# Patient Record
Sex: Female | Born: 1990 | Race: Black or African American | Hispanic: No | Marital: Single | Smoking: Current every day smoker
Health system: Southern US, Community
[De-identification: ages and names within clinical notes are randomized; demographics above are authoritative.]

## PROBLEM LIST (undated history)

## (undated) DIAGNOSIS — R569 Unspecified convulsions: Secondary | ICD-10-CM

---
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
## 2014-07-03 ENCOUNTER — Emergency Department (HOSPITAL_BASED_OUTPATIENT_CLINIC_OR_DEPARTMENT_OTHER)
Admission: EM | Admit: 2014-07-03 | Discharge: 2014-07-03 | Disposition: A | Discharge status: Home / Self Care | Payer: Self-pay | Attending: Emergency Medicine | Admitting: Emergency Medicine

## 2014-07-03 ENCOUNTER — Encounter (HOSPITAL_BASED_OUTPATIENT_CLINIC_OR_DEPARTMENT_OTHER): Payer: Self-pay | Admitting: Emergency Medicine

## 2014-07-03 DIAGNOSIS — Z3202 Encounter for pregnancy test, result negative: Secondary | ICD-10-CM | POA: Insufficient documentation

## 2014-07-03 DIAGNOSIS — Z720 Tobacco use: Secondary | ICD-10-CM | POA: Insufficient documentation

## 2014-07-03 DIAGNOSIS — N390 Urinary tract infection, site not specified: Secondary | ICD-10-CM | POA: Insufficient documentation

## 2014-07-03 DIAGNOSIS — G40909 Epilepsy, unspecified, not intractable, without status epilepticus: Secondary | ICD-10-CM | POA: Insufficient documentation

## 2014-07-03 DIAGNOSIS — Z79899 Other long term (current) drug therapy: Secondary | ICD-10-CM | POA: Insufficient documentation

## 2014-07-03 HISTORY — DX: Unspecified convulsions: R56.9

## 2014-07-03 LAB — URINALYSIS, ROUTINE W REFLEX MICROSCOPIC
Bilirubin Urine: NEGATIVE
Glucose, UA: NEGATIVE mg/dL
Hgb urine dipstick: NEGATIVE
KETONES UR: NEGATIVE mg/dL
NITRITE: NEGATIVE
PH: 6.5 (ref 5.0–8.0)
PROTEIN: NEGATIVE mg/dL
Specific Gravity, Urine: 1.028 (ref 1.005–1.030)
Urobilinogen, UA: 1 mg/dL (ref 0.0–1.0)

## 2014-07-03 LAB — URINE MICROSCOPIC-ADD ON

## 2014-07-03 LAB — PREGNANCY, URINE: Preg Test, Ur: NEGATIVE

## 2014-07-03 MED ORDER — SULFAMETHOXAZOLE-TRIMETHOPRIM 800-160 MG PO TABS
2.0000 | ORAL_TABLET | Freq: Once | ORAL | Status: AC
  Administered 2014-07-03: 2 via ORAL
  Filled 2014-07-03: qty 2

## 2014-07-03 MED ORDER — PHENAZOPYRIDINE HCL 200 MG PO TABS: 200.0000 mg | ORAL_TABLET | Freq: Three times a day (TID) | ORAL | Status: AC

## 2014-07-03 MED ORDER — SULFAMETHOXAZOLE-TRIMETHOPRIM 800-160 MG PO TABS: 1.0000 | ORAL_TABLET | Freq: Two times a day (BID) | ORAL | Status: AC

## 2014-07-03 MED ORDER — PHENAZOPYRIDINE HCL 100 MG PO TABS
200.0000 mg | ORAL_TABLET | Freq: Once | ORAL | Status: AC
  Administered 2014-07-03: 200 mg via ORAL
  Filled 2014-07-03: qty 2

## 2014-07-03 NOTE — ED Notes (Signed)
Pt states that she has had urinary problems, frequency and back pain on the right side.

## 2014-07-03 NOTE — ED Provider Notes (Signed)
CSN: [id number]     Arrival date & time 07/03/14  0056 History   First MD Initiated Contact with Patient 07/03/14 0102     Chief Complaint  Patient presents with  . Urinary Problems      (Consider location/radiation/quality/duration/timing/severity/associated sxs/prior Treatment) HPI 24 year old female presents to emergency department from home with complaint of bilateral flank pain, urinary frequency and burning.  Symptoms have been ongoing for 2 days.  No fever, chills, nausea or vomiting.  She denies any vaginal itching, discharge.  No new sexual partners.  Last menstrual period 2 weeks ago Past Medical History  Diagnosis Date  . Seizures    History reviewed. No pertinent past surgical history. History reviewed. No pertinent family history. History  Substance Use Topics  . Smoking status: Current Every Day Smoker  . Smokeless tobacco: Not on file  . Alcohol Use: Yes   OB History    No data available     Review of Systems  See History of Present Illness; otherwise all other systems are reviewed and negative   Allergies  Review of patient's allergies indicates no known allergies.  Home Medications   Prior to Admission medications   Medication Sig Start Date End Date Taking? Authorizing Provider  levETIRAcetam (KEPPRA) 1000 MG tablet Take 1,000 mg by mouth 2 (two) times daily.   Yes Historical Provider, MD  phenazopyridine (PYRIDIUM) 200 MG tablet Take 1 tablet (200 mg total) by mouth 3 (three) times daily. 07/03/14   Olivia Mackielga M Maddalynn Barnard, MD  sulfamethoxazole-trimethoprim (SEPTRA DS) 800-160 MG per tablet Take 1 tablet by mouth every 12 (twelve) hours. 07/03/14   Olivia Mackielga M Guilherme Schwenke, MD   BP 124/75 mmHg  Pulse 71  Temp(Src) 98.6 F (37 C) (Oral)  Resp 16  Wt 139 lb (63.05 kg)  SpO2 100%  LMP 06/19/2014 Physical Exam  Constitutional: She is oriented to person, place, and time. She appears well-developed and well-nourished.  HENT:  Head: Normocephalic and atraumatic.  Nose: Nose  normal.  Mouth/Throat: Oropharynx is clear and moist.  Eyes: Conjunctivae and EOM are normal. Pupils are equal, round, and reactive to light.  Neck: Normal range of motion. Neck supple. No JVD present. No tracheal deviation present. No thyromegaly present.  Cardiovascular: Normal rate, regular rhythm, normal heart sounds and intact distal pulses.  Exam reveals no gallop and no friction rub.   No murmur heard. Pulmonary/Chest: Effort normal and breath sounds normal. No stridor. No respiratory distress. She has no wheezes. She has no rales. She exhibits no tenderness.  Abdominal: Soft. Bowel sounds are normal. She exhibits no distension and no mass. There is no tenderness. There is no rebound and no guarding.  Musculoskeletal: Normal range of motion. She exhibits no edema or tenderness.  Lymphadenopathy:    She has no cervical adenopathy.  Neurological: She is alert and oriented to person, place, and time. She displays normal reflexes. She exhibits normal muscle tone. Coordination normal.  Skin: Skin is warm and dry. No rash noted. No erythema. No pallor.  Psychiatric: She has a normal mood and affect. Her behavior is normal. Judgment and thought content normal.  Nursing note and vitals reviewed.   ED Course  Procedures (including critical care time) Labs Review Labs Reviewed  URINALYSIS, ROUTINE W REFLEX MICROSCOPIC - Abnormal; Notable for the following:    APPearance CLOUDY (*)    Leukocytes, UA MODERATE (*)    All other components within normal limits  URINE MICROSCOPIC-ADD ON - Abnormal; Notable for the following:  Squamous Epithelial / LPF FEW (*)    Bacteria, UA FEW (*)    All other components within normal limits  PREGNANCY, URINE    Imaging Review No results found.   EKG Interpretation None      MDM   Final diagnoses:  UTI (lower urinary tract infection)    24 year old female with symptoms of UTI, urinalysis consistent with same.  She is not ill-appearing.  Plan  for treatment with Pyridium and Septra.    Olivia Mackie, MD 07/03/14 [id number]

## 2014-07-03 NOTE — Discharge Instructions (Signed)
Take antibiotics as prescribed for the next 5 days.  Plenty of fluids.  Return to emergency department for worsening pain, fevers, or other concerning symptoms.   Urinary Tract Infection Urinary tract infections (UTIs) can develop anywhere along your urinary tract. Your urinary tract is your body's drainage system for removing wastes and extra water. Your urinary tract includes two kidneys, two ureters, a bladder, and a urethra. Your kidneys are a pair of bean-shaped organs. Each kidney is about the size of your fist. They are located below your ribs, one on each side of your spine. CAUSES Infections are caused by microbes, which are microscopic organisms, including fungi, viruses, and bacteria. These organisms are so small that they can only be seen through a microscope. Bacteria are the microbes that most commonly cause UTIs. SYMPTOMS  Symptoms of UTIs may vary by age and gender of the patient and by the location of the infection. Symptoms in young women typically include a frequent and intense urge to urinate and a painful, burning feeling in the bladder or urethra during urination. Older women and men are more likely to be tired, shaky, and weak and have muscle aches and abdominal pain. A fever may mean the infection is in your kidneys. Other symptoms of a kidney infection include pain in your back or sides below the ribs, nausea, and vomiting. DIAGNOSIS To diagnose a UTI, your caregiver will ask you about your symptoms. Your caregiver also will ask to provide a urine sample. The urine sample will be tested for bacteria and white blood cells. White blood cells are made by your body to help fight infection. TREATMENT  Typically, UTIs can be treated with medication. Because most UTIs are caused by a bacterial infection, they usually can be treated with the use of antibiotics. The choice of antibiotic and length of treatment depend on your symptoms and the type of bacteria causing your infection. HOME  CARE INSTRUCTIONS  If you were prescribed antibiotics, take them exactly as your caregiver instructs you. Finish the medication even if you feel better after you have only taken some of the medication.  Drink enough water and fluids to keep your urine clear or pale yellow.  Avoid caffeine, tea, and carbonated beverages. They tend to irritate your bladder.  Empty your bladder often. Avoid holding urine for long periods of time.  Empty your bladder before and after sexual intercourse.  After a bowel movement, women should cleanse from front to back. Use each tissue only once. SEEK MEDICAL CARE IF:   You have back pain.  You develop a fever.  Your symptoms do not begin to resolve within 3 days. SEEK IMMEDIATE MEDICAL CARE IF:   You have severe back pain or lower abdominal pain.  You develop chills.  You have nausea or vomiting.  You have continued burning or discomfort with urination. MAKE SURE YOU:   Understand these instructions.  Will watch your condition.  Will get help right away if you are not doing well or get worse. Document Released: 03/24/2005 Document Revised: 12/14/2011 Document Reviewed: 07/23/2011 [facility] Patient Information 2015 [place], [place]. This information is not intended to replace advice given to you by your health care provider. Make sure you discuss any questions you have with your health care provider.

## 2015-09-20 ENCOUNTER — Encounter (HOSPITAL_BASED_OUTPATIENT_CLINIC_OR_DEPARTMENT_OTHER): Payer: Self-pay | Admitting: Emergency Medicine

## 2015-09-20 ENCOUNTER — Emergency Department (HOSPITAL_BASED_OUTPATIENT_CLINIC_OR_DEPARTMENT_OTHER)
Admission: EM | Admit: 2015-09-20 | Discharge: 2015-09-20 | Disposition: A | Discharge status: Home / Self Care | Payer: Self-pay | Attending: Emergency Medicine | Admitting: Emergency Medicine

## 2015-09-20 ENCOUNTER — Emergency Department (HOSPITAL_BASED_OUTPATIENT_CLINIC_OR_DEPARTMENT_OTHER): Payer: Self-pay

## 2015-09-20 DIAGNOSIS — O219 Vomiting of pregnancy, unspecified: Secondary | ICD-10-CM | POA: Insufficient documentation

## 2015-09-20 DIAGNOSIS — O99331 Smoking (tobacco) complicating pregnancy, first trimester: Secondary | ICD-10-CM | POA: Insufficient documentation

## 2015-09-20 DIAGNOSIS — O99281 Endocrine, nutritional and metabolic diseases complicating pregnancy, first trimester: Secondary | ICD-10-CM | POA: Insufficient documentation

## 2015-09-20 DIAGNOSIS — F17200 Nicotine dependence, unspecified, uncomplicated: Secondary | ICD-10-CM | POA: Insufficient documentation

## 2015-09-20 DIAGNOSIS — Z79899 Other long term (current) drug therapy: Secondary | ICD-10-CM | POA: Insufficient documentation

## 2015-09-20 DIAGNOSIS — Z3A01 Less than 8 weeks gestation of pregnancy: Secondary | ICD-10-CM | POA: Insufficient documentation

## 2015-09-20 DIAGNOSIS — E876 Hypokalemia: Secondary | ICD-10-CM | POA: Insufficient documentation

## 2015-09-20 DIAGNOSIS — O210 Mild hyperemesis gravidarum: Secondary | ICD-10-CM

## 2015-09-20 LAB — CBC WITH DIFFERENTIAL/PLATELET
BASOS ABS: 0 10*3/uL (ref 0.0–0.1)
Basophils Relative: 0 %
EOS PCT: 0 %
Eosinophils Absolute: 0 10*3/uL (ref 0.0–0.7)
HCT: 36.1 % (ref 36.0–46.0)
Hemoglobin: 13.8 g/dL (ref 12.0–15.0)
LYMPHS ABS: 1 10*3/uL (ref 0.7–4.0)
LYMPHS PCT: 8 %
MCH: 29.3 pg (ref 26.0–34.0)
MCHC: 38.2 g/dL — ABNORMAL HIGH (ref 30.0–36.0)
MCV: 76.6 fL — AB (ref 78.0–100.0)
MONO ABS: 1 10*3/uL (ref 0.1–1.0)
Monocytes Relative: 8 %
Neutro Abs: 10.8 10*3/uL — ABNORMAL HIGH (ref 1.7–7.7)
Neutrophils Relative %: 85 %
PLATELETS: 336 10*3/uL (ref 150–400)
RBC: 4.71 MIL/uL (ref 3.87–5.11)
RDW: 13.1 % (ref 11.5–15.5)
WBC: 12.7 10*3/uL — AB (ref 4.0–10.5)

## 2015-09-20 LAB — BASIC METABOLIC PANEL
ANION GAP: 13 (ref 5–15)
BUN: 9 mg/dL (ref 6–20)
CO2: 22 mmol/L (ref 22–32)
Calcium: 9.5 mg/dL (ref 8.9–10.3)
Chloride: 96 mmol/L — ABNORMAL LOW (ref 101–111)
Creatinine, Ser: 0.68 mg/dL (ref 0.44–1.00)
GFR calc Af Amer: >60 mL/min (ref >60)
GFR calc non Af Amer: >60 mL/min (ref >60)
GLUCOSE: 93 mg/dL (ref 65–99)
POTASSIUM: 2.7 mmol/L — AB (ref 3.5–5.1)
Sodium: 131 mmol/L — ABNORMAL LOW (ref 135–145)

## 2015-09-20 LAB — HCG, QUANTITATIVE, PREGNANCY: hCG, Beta Chain, Quant, S: 127047 m[IU]/mL — ABNORMAL HIGH (ref <5)

## 2015-09-20 MED ORDER — POTASSIUM CHLORIDE ER 20 MEQ PO TBCR: 10.0000 meq | EXTENDED_RELEASE_TABLET | Freq: Every day | ORAL | Status: AC

## 2015-09-20 MED ORDER — SODIUM CHLORIDE 0.9 % IV BOLUS (SEPSIS)
1000.0000 mL | Freq: Once | INTRAVENOUS | Status: AC
  Administered 2015-09-20: 1000 mL via INTRAVENOUS

## 2015-09-20 MED ORDER — POTASSIUM CHLORIDE 10 MEQ/100ML IV SOLN
10.0000 meq | Freq: Once | INTRAVENOUS | Status: AC
  Administered 2015-09-20: 10 meq via INTRAVENOUS
  Filled 2015-09-20: qty 100

## 2015-09-20 MED ORDER — POTASSIUM CHLORIDE CRYS ER 20 MEQ PO TBCR
20.0000 meq | EXTENDED_RELEASE_TABLET | Freq: Once | ORAL | Status: AC
  Administered 2015-09-20: 20 meq via ORAL
  Filled 2015-09-20: qty 1

## 2015-09-20 MED ORDER — ONDANSETRON HCL 4 MG PO TABS: 4.0000 mg | ORAL_TABLET | Freq: Three times a day (TID) | ORAL | Status: AC | PRN

## 2015-09-20 MED ORDER — ONDANSETRON HCL 4 MG/2ML IJ SOLN
4.0000 mg | Freq: Once | INTRAMUSCULAR | Status: AC
  Administered 2015-09-20: 4 mg via INTRAVENOUS
  Filled 2015-09-20: qty 2

## 2015-09-20 NOTE — ED Provider Notes (Signed)
CSN: [id number]     Arrival date & time 09/20/15  1432 History  By signing my name below, I, Candace Michael, attest that this documentation has been prepared under the direction and in the presence of Nelva Nayobert Tell Rozelle, MD. Electronically Signed: Bethel BornBritney Michael, ED Scribe. 09/20/2015. 4:37 PM   Chief Complaint  Patient presents with  . Vomiting    The history is provided by the patient. No language interpreter was used.   Candace Michael is a 25 y.o. female who presents to the Emergency Department complaining of nausea and vomiting with onset 15 days ago. Pt states that last night after vomiting she had a near syncopal episode. After the episode her lips started to tingle and her hands "locked up". She last had an episode of emesis just PTA.  She is pregnant, for the first time, and unsure how far along she is. Her menstrual periods are irregular but the last one was 4-5 months ago. Pt states that she has been unable to be seen for her pregnancy due to an insurance issue.   Past Medical History  Diagnosis Date  . Seizures (HCC)    History reviewed. No pertinent past surgical history. History reviewed. No pertinent family history. Social History  Substance Use Topics  . Smoking status: Current Every Day Smoker  . Smokeless tobacco: None  . Alcohol Use: Yes   OB History    Gravida Para Term Preterm AB TAB SAB Ectopic Multiple Living   1              Review of Systems  10 Systems reviewed and all are negative for acute change except as noted in the HPI.  Allergies  Review of patient's allergies indicates no known allergies.  Home Medications   Prior to Admission medications   Medication Sig Start Date End Date Taking? Authorizing Provider  levETIRAcetam (KEPPRA) 1000 MG tablet Take 1,000 mg by mouth 2 (two) times daily.    Historical Provider, MD  ondansetron (ZOFRAN) 4 MG tablet Take 1 tablet (4 mg total) by mouth every 8 (eight) hours as needed for nausea or vomiting.  09/20/15   Nelva Nayobert Najia Hurlbutt, MD  phenazopyridine (PYRIDIUM) 200 MG tablet Take 1 tablet (200 mg total) by mouth 3 (three) times daily. 07/03/14   Marisa Severinlga Otter, MD  potassium chloride 20 MEQ TBCR Take 10 mEq by mouth daily. 09/20/15   Nelva Nayobert Jericka Kadar, MD  sulfamethoxazole-trimethoprim (SEPTRA DS) 800-160 MG per tablet Take 1 tablet by mouth every 12 (twelve) hours. 07/03/14   Marisa Severinlga Otter, MD   BP 129/99 mmHg  Pulse 114  Temp(Src) 97.6 F (36.4 C) (Oral)  Resp 26  Ht 5' (1.524 m)  Wt 139 lb (63.05 kg)  BMI 27.15 kg/m2  SpO2 100%  LMP  (LMP Unknown) Physical Exam Physical Exam  Nursing note and vitals reviewed. Constitutional: She is oriented to person, place, and time. She appears well-developed and well-nourished. No distress.  HENT:  Head: Normocephalic and atraumatic.  Eyes: Pupils are equal, round, and reactive to light.  Neck: Normal range of motion.  Cardiovascular: Normal rate and intact distal pulses.   Pulmonary/Chest: No respiratory distress.  Abdominal: Normal appearance. She exhibits no distension.  No tenderness to palpation.  Active bowel sounds.  No rebound or guarding tenderness.  No mass.   Musculoskeletal: Normal range of motion.  Neurological: She is alert and oriented to person, place, and time. No cranial nerve deficit.  Skin: Skin is warm and dry. No rash noted.  ED Course  Procedures (including critical care time) Medications  sodium chloride 0.9 % bolus 1,000 mL (0 mLs Intravenous Stopped 09/20/15 1636)  ondansetron (ZOFRAN) injection 4 mg (4 mg Intravenous Given 09/20/15 1528)  potassium chloride 10 mEq in 100 mL IVPB (0 mEq Intravenous Stopped 09/20/15 1818)  potassium chloride SA (K-DUR,KLOR-CON) CR tablet 20 mEq (20 mEq Oral Given 09/20/15 1722)    DIAGNOSTIC STUDIES: Oxygen Saturation is 100% on RA,  normal by my interpretation.    COORDINATION OF CARE: 3:09 PM Discussed treatment plan which includes lab work, Zofran, and IVF with pt at bedside and pt agreed to  plan.  Labs Review Labs Reviewed  CBC WITH DIFFERENTIAL/PLATELET - Abnormal; Notable for the following:    WBC 12.7 (*)    MCV 76.6 (*)    MCHC 38.2 (*)    Neutro Abs 10.8 (*)    All other components within normal limits  BASIC METABOLIC PANEL - Abnormal; Notable for the following:    Sodium 131 (*)    Potassium 2.7 (*)    Chloride 96 (*)    All other components within normal limits  HCG, QUANTITATIVE, PREGNANCY - Abnormal; Notable for the following:    hCG, Beta Chain, [place] Longest [id number] (*)    All other components within normal limits    Imaging Review US Ob Comp Less 14 Wks  09/20/2015  CLINICAL DATA:  Severe emesis x 15 days, unable to keep anything down. Patient has no prenatal care and is unsure of last period, thinks it was 6 months ago. Patient had positive pregnancy test in [facility] on 09/05/15. Patient denies any pelvic pain or vag bleeding. States she just feels "bad". EXAM: OBSTETRIC <14 WK [place] AND TRANSVAGINAL OB US TECHNIQUE: Both transabdominal and transvaginal ultrasound examinations were performed for complete evaluation of the gestation as well as the maternal uterus, adnexal regions, and pelvic cul-de-sac. Transvaginal technique was performed to assess early pregnancy. COMPARISON:  None. FINDINGS: Intrauterine gestational sac: Visualized/normal in shape. Yolk sac:  Yes Embryo:  Yes Cardiac Activity: Yes Heart Rate: 144  bpm CRL:  12.7  mm   7 w   3 d                  [place] EDC: 05/05/2016 Subchorionic hemorrhage: Small subchronic hemorrhage below the gestational sac. Maternal uterus/adnexae: No uterine masses. Cervix unremarkable. Large right ovarian simple appearing cyst measuring 6.2 x 5.0 x 5.6 cm. Color Doppler blood flow was seen within the ovarian tissue along the margins of this cyst. Normal appearance of the left ovary. No adnexal masses. No free fluid. IMPRESSION: 1. Single live intrauterine pregnancy with a measured gestational age is 7 weeks and 3 days. 2. Small subchronic  hemorrhage. 3. 6.2 cm right ovarian simple appearing cyst. 4. No other abnormalities. Electronically Signed   By: Amie Portland M.D.   On: 09/20/2015 17:53   US Ob Transvaginal  09/20/2015  CLINICAL DATA:  Severe emesis x 15 days, unable to keep anything down. Patient has no prenatal care and is unsure of last period, thinks it was 6 months ago. Patient had positive pregnancy test in [facility] on 09/05/15. Patient denies any pelvic pain or vag bleeding. States she just feels "bad". EXAM: OBSTETRIC <14 WK [place] AND TRANSVAGINAL OB US TECHNIQUE: Both transabdominal and transvaginal ultrasound examinations were performed for complete evaluation of the gestation as well as the maternal uterus, adnexal regions, and pelvic cul-de-sac. Transvaginal technique was performed to assess early pregnancy. COMPARISON:  None. FINDINGS: Intrauterine gestational sac: Visualized/normal in shape. Yolk sac:  Yes Embryo:  Yes Cardiac Activity: Yes Heart Rate: 144  bpm CRL:  12.7  mm   7 w   3 d                  [place] EDC: 05/05/2016 Subchorionic hemorrhage: Small subchronic hemorrhage below the gestational sac. Maternal uterus/adnexae: No uterine masses. Cervix unremarkable. Large right ovarian simple appearing cyst measuring 6.2 x 5.0 x 5.6 cm. Color Doppler blood flow was seen within the ovarian tissue along the margins of this cyst. Normal appearance of the left ovary. No adnexal masses. No free fluid. IMPRESSION: 1. Single live intrauterine pregnancy with a measured gestational age is 7 weeks and 3 days. 2. Small subchronic hemorrhage. 3. 6.2 cm right ovarian simple appearing cyst. 4. No other abnormalities. Electronically Signed   By: Amie Portland M.D.   On: 09/20/2015 17:53   I personally reviewed and evaluated these images and lab results as a part of my medical decision-making.  After treatment in the ED the patient feels back to baseline and wants to go home.  MDM   Final diagnoses:  Hyperemesis arising during pregnancy   Hypokalemia    I personally performed the services described in this documentation, which was scribed in my presence. The recorded information has been reviewed and considered.   Nelva Nay, MD 09/20/15 [phone]

## 2015-09-20 NOTE — Discharge Instructions (Signed)
Eating Plan for Hyperemesis Gravidarum Severe cases of hyperemesis gravidarum can lead to dehydration and malnutrition. The hyperemesis eating plan is one way to lessen the symptoms of nausea and vomiting. It is often used with prescribed medicines to control your symptoms.  WHAT CAN I DO TO RELIEVE MY SYMPTOMS? Listen to your body. Everyone is different and has different preferences. Find what works best for you. Some of the following things may help:  Eat and drink slowly.  Eat 5-6 small meals daily instead of 3 large meals.   Eat crackers before you get out of bed in the morning.   Starchy foods are usually well tolerated (such as cereal, toast, bread, potatoes, pasta, rice, and pretzels).   Ginger may help with nausea. Add  tsp ground ginger to hot tea or choose ginger tea.   Try drinking 100% fruit juice or an electrolyte drink.  Continue to take your prenatal vitamins as directed by your health care provider. If you are having trouble taking your prenatal vitamins, talk with your health care provider about different options.  Include at least 1 serving of protein with your meals and snacks (such as meats or poultry, beans, nuts, eggs, or yogurt). Try eating a protein-rich snack before bed (such as cheese and crackers or a half [place] or peanut butter sandwich). WHAT THINGS SHOULD I AVOID TO REDUCE MY SYMPTOMS? The following things may help reduce your symptoms:  Avoid foods with strong smells. Try eating meals in well-ventilated areas that are free of odors.  Avoid drinking water or other beverages with meals. Try not to drink anything less than 30 minutes before and after meals.  Avoid drinking more than 1 cup of fluid at a time.  Avoid fried or high-fat foods, such as butter and cream sauces.  Avoid spicy foods.  Avoid skipping meals the best you can. Nausea can be more intense on an empty stomach. If you cannot tolerate food at that time, do not force it. Try sucking on  ice chips or other frozen items and make up the calories later.  Avoid lying down within 2 hours after eating.   This information is not intended to replace advice given to you by your health care provider. Make sure you discuss any questions you have with your health care provider.   Document Released: 04/11/2007 Document Revised: 06/19/2013 Document Reviewed: 04/18/2013 Elsevier Interactive Patient Education 2016 [employer]  Hypokalemia Hypokalemia means that the amount of potassium in the blood is lower than normal.Potassium is a chemical, called an electrolyte, that helps regulate the amount of fluid in the body. It also stimulates muscle contraction and helps nerves function properly.Most of the body's potassium is inside of cells, and only a very small amount is in the blood. Because the amount in the blood is so small, minor changes can be life-threatening. CAUSES  Antibiotics.  Diarrhea or vomiting.  Using laxatives too much, which can cause diarrhea.  Chronic kidney disease.  Water pills (diuretics).  Eating disorders (bulimia).  Low magnesium level.  Sweating a lot. SIGNS AND SYMPTOMS  Weakness.  Constipation.  Fatigue.  Muscle cramps.  Mental confusion.  Skipped heartbeats or irregular heartbeat (palpitations).  Tingling or numbness. DIAGNOSIS  Your health care provider can diagnose hypokalemia with blood tests. In addition to checking your potassium level, your health care provider may also check other lab tests. TREATMENT Hypokalemia can be treated with potassium supplements taken by mouth or adjustments in your current medicines. If your potassium level  is very low, you may need to get potassium through a vein (IV) and be monitored in the hospital. A diet high in potassium is also helpful. Foods high in potassium are:  Nuts, such as peanuts and pistachios.  Seeds, such as sunflower seeds and pumpkin seeds.  Peas, lentils, and lima  beans.  Whole grain and bran cereals and breads.  Fresh fruit and vegetables, such as apricots, avocado, bananas, cantaloupe, kiwi, oranges, tomatoes, asparagus, and potatoes.  Orange and tomato juices.  Red meats.  Fruit yogurt. HOME CARE INSTRUCTIONS  Take all medicines as prescribed by your health care provider.  Maintain a healthy diet by including nutritious food, such as fruits, vegetables, nuts, whole grains, and lean meats.  If you are taking a laxative, be sure to follow the directions on the label. SEEK MEDICAL CARE IF:  Your weakness gets worse.  You feel your heart pounding or racing.  You are vomiting or having diarrhea.  You are diabetic and having trouble keeping your blood glucose in the normal range. SEEK IMMEDIATE MEDICAL CARE IF:  You have chest pain, shortness of breath, or dizziness.  You are vomiting or having diarrhea for more than 2 days.  You faint. MAKE SURE YOU:   Understand these instructions.  Will watch your condition.  Will get help right away if you are not doing well or get worse.   This information is not intended to replace advice given to you by your health care provider. Make sure you discuss any questions you have with your health care provider.   Document Released: 06/14/2005 Document Revised: 07/05/2014 Document Reviewed: 12/15/2012 Elsevier Interactive Patient Education 2016 Elsevier Inc.  Potassium Content of Foods Potassium is a mineral found in many foods and drinks. It helps keep fluids and minerals balanced in your body and affects how steadily your heart beats. Potassium also helps control your blood pressure and keep your muscles and nervous system healthy. Certain health conditions and medicines may change the balance of potassium in your body. When this happens, you can help balance your level of potassium through the foods that you do or do not eat. Your health care provider or dietitian may recommend an amount of  potassium that you should have each day. The following lists of foods provide the amount of potassium (in parentheses) per serving in each item. HIGH IN POTASSIUM  The following foods and beverages have 200 mg or more of potassium per serving:  Apricots, 2 raw or 5 dry (200 mg).  Artichoke, 1 medium (345 mg).  Avocado, raw,  each (245 mg).  Banana, 1 medium (425 mg).  Beans, lima, or baked beans, canned,  cup (280 mg).  Beans, white, canned,  cup (595 mg).  Beef roast, 3 oz (320 mg).  Beef, ground, 3 oz (270 mg).  Beets, raw or cooked,  cup (260 mg).  Bran muffin, 2 oz (300 mg).  Broccoli,  cup (230 mg).  Brussels sprouts,  cup (250 mg).  Cantaloupe,  cup (215 mg).  Cereal, 100% bran,  cup (200-400 mg).  Cheeseburger, single, fast food, 1 each (225-400 mg).  Chicken, 3 oz (220 mg).  Clams, canned, 3 oz (535 mg).  Crab, 3 oz (225 mg).  Dates, 5 each (270 mg).  Dried beans and peas,  cup (300-475 mg).  Figs, dried, 2 each (260 mg).  Fish: halibut, tuna, cod, snapper, 3 oz (480 mg).  Fish: salmon, haddock, swordfish, perch, 3 oz (300 mg).  Fish, tuna, canned  3 oz (200 mg).  [place] fries, fast food, 3 oz (470 mg).  Granola with fruit and nuts,  cup (200 mg).  Grapefruit juice,  cup (200 mg).  Greens, beet,  cup (655 mg).  Honeydew melon,  cup (200 mg).  Kale, raw, 1 cup (300 mg).  Kiwi, 1 medium (240 mg).  Kohlrabi, rutabaga, parsnips,  cup (280 mg).  Lentils,  cup (365 mg).  Mango, 1 each (325 mg).  Milk, chocolate, 1 cup (420 mg).  Milk: nonfat, low-fat, whole, buttermilk, 1 cup (350-380 mg).  Molasses, 1 Tbsp (295 mg).  Mushrooms,  cup (280) mg.  Nectarine, 1 each (275 mg).  Nuts: almonds, peanuts, hazelnuts, [place], cashew, mixed, 1 oz (200 mg).  Nuts, pistachios, 1 oz (295 mg).  Orange, 1 each (240 mg).  Orange juice,  cup (235 mg).  Papaya, medium,  fruit (390 mg).  Peanut butter, chunky, 2 Tbsp (240  mg).  Peanut butter, smooth, 2 Tbsp (210 mg).  Pear, 1 medium (200 mg).  Pomegranate, 1 whole (400 mg).  Pomegranate juice,  cup (215 mg).  Pork, 3 oz (350 mg).  Potato chips, salted, 1 oz (465 mg).  Potato, baked with skin, 1 medium (925 mg).  Potatoes, boiled,  cup (255 mg).  Potatoes, mashed,  cup (330 mg).  Prune juice,  cup (370 mg).  Prunes, 5 each (305 mg).  Pudding, chocolate,  cup (230 mg).  Pumpkin, canned,  cup (250 mg).  Raisins, seedless,  cup (270 mg).  Seeds, sunflower or pumpkin, 1 oz (240 mg).  Soy milk, 1 cup (300 mg).  Spinach,  cup (420 mg).  Spinach, canned,  cup (370 mg).  Sweet potato, baked with skin, 1 medium (450 mg).  Swiss chard,  cup (480 mg).  Tomato or vegetable juice,  cup (275 mg).  Tomato sauce or puree,  cup (400-550 mg).  Tomato, raw, 1 medium (290 mg).  Tomatoes, canned,  cup (200-300 mg).  [place], 3 oz (250 mg).  Wheat germ, 1 oz (250 mg).  Winter squash,  cup (250 mg).  Yogurt, plain or fruited, 6 oz (260-435 mg).  Zucchini,  cup (220 mg). MODERATE IN POTASSIUM The following foods and beverages have 50-200 mg of potassium per serving:  Apple, 1 each (150 mg).  Apple juice,  cup (150 mg).  Applesauce,  cup (90 mg).  Apricot nectar,  cup (140 mg).  Asparagus, small spears,  cup or 6 spears (155 mg).  Bagel, cinnamon raisin, 1 each (130 mg).  Bagel, egg or plain, 4 in., 1 each (70 mg).  Beans, green,  cup (90 mg).  Beans, yellow,  cup (190 mg).  Beer, regular, 12 oz (100 mg).  Beets, canned,  cup (125 mg).  Blackberries,  cup (115 mg).  Blueberries,  cup (60 mg).  Bread, whole wheat, 1 slice (70 mg).  Broccoli, raw,  cup (145 mg).  Cabbage,  cup (150 mg).  Carrots, cooked or raw,  cup (180 mg).  Cauliflower, raw,  cup (150 mg).  Celery, raw,  cup (155 mg).  Cereal, bran flakes, cup (120-150 mg).  Cheese, cottage,  cup (110 mg).  Cherries, 10 each  (150 mg).  Chocolate, 1 oz bar (165 mg).  Coffee, brewed 6 oz (90 mg).  Corn,  cup or 1 ear (195 mg).  Cucumbers,  cup (80 mg).  Egg, large, 1 each (60 mg).  Eggplant,  cup (60 mg).  Endive, raw, cup (80 mg).  English muffin, 1 each (  65 mg).  Fish, orange roughy, 3 oz (150 mg).  Frankfurter, beef or pork, 1 each (75 mg).  Fruit cocktail,  cup (115 mg).  Grape juice,  cup (170 mg).  Grapefruit,  fruit (175 mg).  Grapes,  cup (155 mg).  Greens: kale, turnip, collard,  cup (110-150 mg).  Ice cream or frozen yogurt, chocolate,  cup (175 mg).  Ice cream or frozen yogurt, vanilla,  cup (120-150 mg).  Lemons, limes, 1 each (80 mg).  Lettuce, all types, 1 cup (100 mg).  Mixed vegetables,  cup (150 mg).  Mushrooms, raw,  cup (110 mg).  Nuts: walnuts, pecans, or macadamia, 1 oz (125 mg).  Oatmeal,  cup (80 mg).  Okra,  cup (110 mg).  Onions, raw,  cup (120 mg).  Peach, 1 each (185 mg).  Peaches, canned,  cup (120 mg).  Pears, canned,  cup (120 mg).  Peas, green, frozen,  cup (90 mg).  Peppers, green,  cup (130 mg).  Peppers, red,  cup (160 mg).  Pineapple juice,  cup (165 mg).  Pineapple, fresh or canned,  cup (100 mg).  Plums, 1 each (105 mg).  Pudding, vanilla,  cup (150 mg).  Raspberries,  cup (90 mg).  Rhubarb,  cup (115 mg).  Rice, wild,  cup (80 mg).  Shrimp, 3 oz (155 mg).  Spinach, raw, 1 cup (170 mg).  Strawberries,  cup (125 mg).  Summer squash  cup (175-200 mg).  Swiss chard, raw, 1 cup (135 mg).  Tangerines, 1 each (140 mg).  Tea, brewed, 6 oz (65 mg).  Turnips,  cup (140 mg).  Watermelon,  cup (85 mg).  Wine, red, table, 5 oz (180 mg).  Wine, white, table, 5 oz (100 mg). LOW IN POTASSIUM The following foods and beverages have less than 50 mg of potassium per serving.  Bread, white, 1 slice (30 mg).  Carbonated beverages, 12 oz (less than 5 mg).  Cheese, 1 oz (20-30  mg).  Cranberries,  cup (45 mg).  Cranberry juice cocktail,  cup (20 mg).  Fats and oils, 1 Tbsp (less than 5 mg).  Hummus, 1 Tbsp (32 mg).  Nectar: papaya, mango, or pear,  cup (35 mg).  Rice, white or brown,  cup (50 mg).  Spaghetti or macaroni,  cup cooked (30 mg).  Tortilla, flour or corn, 1 each (50 mg).  Waffle, 4 in., 1 each (50 mg).  Water chestnuts,  cup (40 mg).   This information is not intended to replace advice given to you by your health care provider. Make sure you discuss any questions you have with your health care provider.   Document Released: 01/26/2005 Document Revised: 06/19/2013 Document Reviewed: 05/11/2013 Elsevier Interactive Patient Education 2016 Elsevier Inc.  Morning Sickness Morning sickness is when you feel sick to your stomach (nauseous) during pregnancy. You may feel sick to your stomach and throw up (vomit). You may feel sick in the morning, but you can feel this way any time of day. Some women feel very sick to their stomach and cannot stop throwing up (hyperemesis gravidarum). HOME CARE  Only take medicines as told by your doctor.  Take multivitamins as told by your doctor. Taking multivitamins before getting pregnant can stop or lessen the harshness of morning sickness.  Eat dry toast or unsalted crackers before getting out of bed.  Eat 5 to 6 small meals a day.  Eat dry and bland foods like rice and baked potatoes.  Do not drink liquids with  meals. Drink between meals.  Do not eat greasy, fatty, or spicy foods.  Have someone cook for you if the smell of food causes you to feel sick or throw up.  If you feel sick to your stomach after taking prenatal vitamins, take them at night or with a snack.  Eat protein when you need a snack (nuts, yogurt, cheese).  Eat unsweetened gelatins for dessert.  Wear a bracelet used for sea sickness (acupressure wristband).  Go to a doctor that puts thin needles into certain body points  (acupuncture) to improve how you feel.  Do not smoke.  Use a humidifier to keep the air in your house free of odors.  Get lots of fresh air. GET HELP IF:  You need medicine to feel better.  You feel dizzy or lightheaded.  You are losing weight. GET HELP RIGHT AWAY IF:   You feel very sick to your stomach and cannot stop throwing up.  You pass out (faint). MAKE SURE YOU:  Understand these instructions.  Will watch your condition.  Will get help right away if you are not doing well or get worse.   This information is not intended to replace advice given to you by your health care provider. Make sure you discuss any questions you have with your health care provider.   Document Released: 07/22/2004 Document Revised: 07/05/2014 Document Reviewed: 11/29/2012 Elsevier Interactive Patient Education [employer]

## 2015-09-20 NOTE — ED Notes (Signed)
Patient pregnant was told this about 2 weeks ago, the patient reports that she is "worried" because "my hands keep locking up". The patient reports that she is unsure how far a long sh eis is, she is unable to get seen. She is unsure of when her last period was because she has irregular periods. Patient is crying and very anxious in triage. The patient reports that she keeps vomiting and she is "really upset because of her hand locking up and her lips numb and tingling"

## 2015-09-20 NOTE — ED Notes (Signed)
Dr. Radford PaxBeaton notified of potassium 2.7.

## 2017-06-21 ENCOUNTER — Emergency Department (HOSPITAL_BASED_OUTPATIENT_CLINIC_OR_DEPARTMENT_OTHER)
Admission: EM | Admit: 2017-06-21 | Discharge: 2017-06-21 | Disposition: A | Discharge status: Home / Self Care | Payer: Self-pay | Attending: Emergency Medicine | Admitting: Emergency Medicine

## 2017-06-21 ENCOUNTER — Encounter (HOSPITAL_BASED_OUTPATIENT_CLINIC_OR_DEPARTMENT_OTHER): Payer: Self-pay | Admitting: Emergency Medicine

## 2017-06-21 ENCOUNTER — Other Ambulatory Visit: Payer: Self-pay

## 2017-06-21 DIAGNOSIS — M545 Low back pain, unspecified: Secondary | ICD-10-CM

## 2017-06-21 DIAGNOSIS — Z79899 Other long term (current) drug therapy: Secondary | ICD-10-CM | POA: Insufficient documentation

## 2017-06-21 DIAGNOSIS — R350 Frequency of micturition: Secondary | ICD-10-CM | POA: Insufficient documentation

## 2017-06-21 DIAGNOSIS — R3915 Urgency of urination: Secondary | ICD-10-CM

## 2017-06-21 DIAGNOSIS — F17200 Nicotine dependence, unspecified, uncomplicated: Secondary | ICD-10-CM | POA: Insufficient documentation

## 2017-06-21 LAB — URINALYSIS, ROUTINE W REFLEX MICROSCOPIC
Bilirubin Urine: NEGATIVE
GLUCOSE, UA: NEGATIVE mg/dL
Hgb urine dipstick: NEGATIVE
Ketones, ur: 15 mg/dL — AB
LEUKOCYTES UA: NEGATIVE
Nitrite: NEGATIVE
Protein, ur: NEGATIVE mg/dL
Specific Gravity, Urine: >1.03 — ABNORMAL HIGH (ref 1.005–1.030)
pH: 6 (ref 5.0–8.0)

## 2017-06-21 LAB — PREGNANCY, URINE: Preg Test, Ur: NEGATIVE

## 2017-06-21 MED ORDER — NAPROXEN 500 MG PO TABS: 500.0000 mg | ORAL_TABLET | Freq: Two times a day (BID) | ORAL | 0 refills | Status: DC

## 2017-06-21 MED ORDER — NAPROXEN 250 MG PO TABS
500.0000 mg | ORAL_TABLET | Freq: Once | ORAL | Status: AC
  Administered 2017-06-21: 500 mg via ORAL
  Filled 2017-06-21: qty 2

## 2017-06-21 NOTE — Discharge Instructions (Signed)
You were seen today for urinary urgency and back pain.  Your urine is normal.  Urine culture is pending.  Take naproxen as needed for pain.  If you develop fevers or worsening symptoms you should be reevaluated.

## 2017-06-21 NOTE — ED Provider Notes (Signed)
[facility] EMERGENCY DEPARTMENT Provider Note   CSN: [id number] Arrival date & time: 06/21/17  [id number]     History   Chief Complaint Chief Complaint  Patient presents with  . Urinary Frequency  . Back Pain    HPI Candace Michael is a 26 y.o. female.  HPI  This is a 26 year old female who presents with urinary urgency and back pain.  Patient states that she checked again because she brought in with her friend for her "emergency."  She reports 2-day history of urinary urgency.  Denies dysuria.  She also has had bilateral lower back pain that is dull and nonradiating.  She denies any fevers or systemic symptoms.  Denies concerns for STDs.  No vaginal discharge.  She has not taken anything for her symptoms.  Past Medical History:  Diagnosis Date  . Seizures (HCC)     There are no active problems to display for this patient.   History reviewed. No pertinent surgical history.  OB History    Gravida Para Term Preterm AB Living   1             SAB TAB Ectopic Multiple Live Births                   Home Medications    Prior to Admission medications   Medication Sig Start Date End Date Taking? Authorizing Provider  levETIRAcetam (KEPPRA) 1000 MG tablet Take 1,000 mg by mouth 2 (two) times daily.    [provider]  naproxen (NAPROSYN) 500 MG tablet Take 1 tablet (500 mg total) by mouth 2 (two) times daily. 06/21/17   Kamera Dubas, Mayer Maskerourtney F, MD  ondansetron (ZOFRAN) 4 MG tablet Take 1 tablet (4 mg total) by mouth every 8 (eight) hours as needed for nausea or vomiting. 09/20/15   Nelva NayBeaton, Robert, MD  phenazopyridine (PYRIDIUM) 200 MG tablet Take 1 tablet (200 mg total) by mouth 3 (three) times daily. 07/03/14   Marisa Severintter, Olga, MD  potassium chloride 20 MEQ TBCR Take 10 mEq by mouth daily. 09/20/15   Nelva NayBeaton, Robert, MD  sulfamethoxazole-trimethoprim (SEPTRA DS) 800-160 MG per tablet Take 1 tablet by mouth every 12 (twelve) hours. 07/03/14   Marisa Severintter, Olga, MD    Family  History No family history on file.  Social History Social History   Tobacco Use  . Smoking status: Current Every Day Smoker  Substance Use Topics  . Alcohol use: Yes  . Drug use: Yes    Types: Marijuana     Allergies   Patient has no known allergies.   Review of Systems Review of Systems  Constitutional: Negative for fever.  Genitourinary: Positive for urgency. Negative for dysuria and frequency.  Musculoskeletal: Positive for back pain.  All other systems reviewed and are negative.    Physical Exam Updated Vital Signs BP 128/81 (BP Location: Right Arm)   Pulse (!) 103   Temp 98.3 F (36.8 C) (Oral)   Resp 20   LMP 05/24/2017   SpO2 100%   Physical Exam  Constitutional: She is oriented to person, place, and time. She appears well-developed and well-nourished.  HENT:  Head: Normocephalic and atraumatic.  Cardiovascular: Normal rate, regular rhythm and normal heart sounds.  Pulmonary/Chest: Effort normal and breath sounds normal. No respiratory distress. She has no wheezes.  Abdominal: Soft. There is no tenderness. There is no rebound and no guarding.  Genitourinary:  Genitourinary Comments: No CVA tenderness  Neurological: She is alert and oriented to person, place,  and time.  Skin: Skin is warm and dry.  Psychiatric: She has a normal mood and affect.  Nursing note and vitals reviewed.    ED Treatments / Results  Labs (all labs ordered are listed, but only abnormal results are displayed) Labs Reviewed  URINALYSIS, ROUTINE W REFLEX MICROSCOPIC - Abnormal; Notable for the following components:      Result Value   APPearance CLOUDY (*)    Specific Gravity, Urine >1.030 (*)    Ketones, ur 15 (*)    All other components within normal limits  URINE CULTURE  PREGNANCY, URINE    EKG  EKG Interpretation None       Radiology No results found.  Procedures Procedures (including critical care time)  Medications Ordered in ED Medications  naproxen  (NAPROSYN) tablet 500 mg (not administered)     Initial Impression / Assessment and Plan / ED Course  I have reviewed the triage vital signs and the nursing notes.  Pertinent labs & imaging results that were available during my care of the patient were reviewed by me and considered in my medical decision making (see chart for details).     Patient presents with urinary urgency and low back pain.  She is nontoxic on exam.  Vital signs reassuring.  Afebrile.  No CVA tenderness.  Urinalysis does not show any evidence of infection at this time.  Given symptoms, urine culture was sent.  Patient was given naproxen.  Will follow urine culture but would not treat at this time.  No signs or symptoms of pyelonephritis.  After history, exam, and medical workup I feel the patient has been appropriately medically screened and is safe for discharge home. Pertinent diagnoses were discussed with the patient. Patient was given return precautions.   Final Clinical Impressions(s) / ED Diagnoses   Final diagnoses:  Urinary urgency  Acute bilateral low back pain without sciatica    ED Discharge Orders        Ordered    naproxen (NAPROSYN) 500 MG tablet  2 times daily     06/21/17 0404       Isis Costanza, Mayer Maskerourtney F, MD 06/21/17 [phone]

## 2017-06-21 NOTE — ED Triage Notes (Signed)
Pt presents with c/o urinary frequency and pain and lower back pain for a couple days.

## 2017-06-21 NOTE — ED Notes (Signed)
Pt discharged to home with friend.

## 2017-06-23 LAB — URINE CULTURE: Culture: 50000 — AB

## 2017-06-24 ENCOUNTER — Telehealth: Payer: Self-pay | Admitting: Emergency Medicine

## 2017-06-24 NOTE — Progress Notes (Signed)
ED Antimicrobial Stewardship Positive Culture Follow Up   Candace Michael is an 26 y.o. female who [facility] with a chief complaint of  Chief Complaint  Patient presents with  . Urinary Frequency  . Back Pain    Recent Results (from the past 720 hour(Michael))  Urine culture     Status: Abnormal   Collection Time: 06/21/17  3:40 AM  Result Value Ref Range Status   Specimen Description URINE, RANDOM  Final   Special Requests NONE  Final   Culture 50,000 COLONIES/mL ESCHERICHIA COLI (A)  Final   Report Status 06/23/2017 FINAL  Final   Organism ID, Bacteria ESCHERICHIA COLI (A)  Final      Susceptibility   Escherichia coli - MIC*    AMPICILLIN <=2 SENSITIVE Sensitive     CEFAZOLIN <=4 SENSITIVE Sensitive     CEFTRIAXONE <=1 SENSITIVE Sensitive     CIPROFLOXACIN <=0.25 SENSITIVE Sensitive     GENTAMICIN <=1 SENSITIVE Sensitive     IMIPENEM <=0.25 SENSITIVE Sensitive     NITROFURANTOIN <=16 SENSITIVE Sensitive     TRIMETH/SULFA <=20 SENSITIVE Sensitive     AMPICILLIN/SULBACTAM <=2 SENSITIVE Sensitive     Extended ESBL NEGATIVE Sensitive     * 50,000 COLONIES/mL ESCHERICHIA COLI    Flow manager to call patient. If symptoms are improved no treatment is needed. If she is still having symptoms start Keflex 500mg  PO TID x 5 days  ED Provider: Francesca OmanSamanthan Petrucelli, PA-C   [name], Candace Michael 06/24/2017, 8:32 AM Infectious Diseases Pharmacist Phone# [phone]

## 2017-06-24 NOTE — Telephone Encounter (Signed)
Post ED Visit - Positive Culture Follow-up: Successful Patient Follow-Up  Culture assessed and recommendations reviewed by: []  Enzo BiNathan Batchelder, Pharm.D. []  Celedonio MiyamotoJeremy Frens, [address]Ph[address] BCPS AQ-ID []  Garvin FilaMike Maccia, Pharm.D., BCPS []  Georgina PillionElizabeth Martin, Pharm.D., BCPS []  LuptonMinh Pham, [address]Ph[address] BCPS, AAHIVP [x]  Estella HuskMichelle Turner, Pharm.D., BCPS, AAHIVP []  Lysle Pearlachel Rumbarger, PharmD, BCPS []  Casilda Carlsaylor Stone, PharmD, BCPS []  Pollyann SamplesAndy Johnston, PharmD, BCPS  Positive urine culture  []  Patient discharged without antimicrobial prescription and treatment is now indicated []  Organism is resistant to prescribed ED discharge antimicrobial []  Patient with positive blood cultures  Changes discussed with ED provider: Russella DarSamantha Petrocelli PA New antibiotic prescription symptom check, if positive symptoms, start Keflex 500mg  po tid x  5days  Attempting to contact patient    Berle MullMiller, Andric Kerce 06/24/2017, 12:14 PM

## 2017-07-06 ENCOUNTER — Telehealth: Payer: Self-pay | Admitting: Emergency Medicine

## 2018-08-06 ENCOUNTER — Encounter (HOSPITAL_BASED_OUTPATIENT_CLINIC_OR_DEPARTMENT_OTHER): Payer: Self-pay | Admitting: *Deleted

## 2018-08-06 ENCOUNTER — Emergency Department (HOSPITAL_BASED_OUTPATIENT_CLINIC_OR_DEPARTMENT_OTHER)
Admission: EM | Admit: 2018-08-06 | Discharge: 2018-08-07 | Disposition: A | Discharge status: Home / Self Care | Payer: Self-pay | Attending: Emergency Medicine | Admitting: Emergency Medicine

## 2018-08-06 DIAGNOSIS — R112 Nausea with vomiting, unspecified: Secondary | ICD-10-CM

## 2018-08-06 DIAGNOSIS — O99330 Smoking (tobacco) complicating pregnancy, unspecified trimester: Secondary | ICD-10-CM | POA: Insufficient documentation

## 2018-08-06 DIAGNOSIS — O26899 Other specified pregnancy related conditions, unspecified trimester: Secondary | ICD-10-CM | POA: Insufficient documentation

## 2018-08-06 DIAGNOSIS — F17290 Nicotine dependence, other tobacco product, uncomplicated: Secondary | ICD-10-CM | POA: Insufficient documentation

## 2018-08-06 DIAGNOSIS — O219 Vomiting of pregnancy, unspecified: Secondary | ICD-10-CM | POA: Insufficient documentation

## 2018-08-06 DIAGNOSIS — N3000 Acute cystitis without hematuria: Secondary | ICD-10-CM | POA: Insufficient documentation

## 2018-08-06 DIAGNOSIS — Z3490 Encounter for supervision of normal pregnancy, unspecified, unspecified trimester: Secondary | ICD-10-CM | POA: Insufficient documentation

## 2018-08-06 LAB — CBG MONITORING, ED: Glucose-Capillary: 84 mg/dL (ref 70–99)

## 2018-08-06 NOTE — ED Triage Notes (Addendum)
Pt c/o light sensitivity, weakness, fatigue, and vomiting x 1 week.

## 2018-08-07 ENCOUNTER — Other Ambulatory Visit: Payer: Self-pay

## 2018-08-07 LAB — URINALYSIS, ROUTINE W REFLEX MICROSCOPIC
Glucose, UA: NEGATIVE mg/dL
Hgb urine dipstick: NEGATIVE
Nitrite: POSITIVE — AB
PH: 6.5 (ref 5.0–8.0)
Protein, ur: 30 mg/dL — AB
Specific Gravity, Urine: 1.02 (ref 1.005–1.030)

## 2018-08-07 LAB — URINALYSIS, MICROSCOPIC (REFLEX)

## 2018-08-07 LAB — CBC WITH DIFFERENTIAL/PLATELET
Abs Immature Granulocytes: 0.03 10*3/uL (ref 0.00–0.07)
BASOS ABS: 0 10*3/uL (ref 0.0–0.1)
Basophils Relative: 0 %
Eosinophils Absolute: 0 10*3/uL (ref 0.0–0.5)
Eosinophils Relative: 0 %
HCT: 37.1 % (ref 36.0–46.0)
Hemoglobin: 13.2 g/dL (ref 12.0–15.0)
Immature Granulocytes: 0 %
Lymphocytes Relative: 14 %
Lymphs Abs: 1.4 10*3/uL (ref 0.7–4.0)
MCH: 28.3 pg (ref 26.0–34.0)
MCHC: 35.6 g/dL (ref 30.0–36.0)
MCV: 79.6 fL — ABNORMAL LOW (ref 80.0–100.0)
Monocytes Absolute: 1 10*3/uL (ref 0.1–1.0)
Monocytes Relative: 10 %
Neutro Abs: 7.6 10*3/uL (ref 1.7–7.7)
Neutrophils Relative %: 76 %
Platelets: 267 10*3/uL (ref 150–400)
RBC: 4.66 MIL/uL (ref 3.87–5.11)
RDW: 12.7 % (ref 11.5–15.5)
WBC: 10 10*3/uL (ref 4.0–10.5)
nRBC: 0 % (ref 0.0–0.2)

## 2018-08-07 LAB — HCG, QUANTITATIVE, PREGNANCY: hCG, Beta Chain, Quant, S: 152850 m[IU]/mL — ABNORMAL HIGH (ref <5)

## 2018-08-07 LAB — BASIC METABOLIC PANEL
Anion gap: 10 (ref 5–15)
BUN: 7 mg/dL (ref 6–20)
CO2: 23 mmol/L (ref 22–32)
Calcium: 9.2 mg/dL (ref 8.9–10.3)
Chloride: 98 mmol/L (ref 98–111)
Creatinine, Ser: 0.68 mg/dL (ref 0.44–1.00)
GFR calc Af Amer: >60 mL/min (ref >60)
GFR calc non Af Amer: >60 mL/min (ref >60)
Glucose, Bld: 84 mg/dL (ref 70–99)
Potassium: 3.1 mmol/L — ABNORMAL LOW (ref 3.5–5.1)
Sodium: 131 mmol/L — ABNORMAL LOW (ref 135–145)

## 2018-08-07 LAB — PREGNANCY, URINE: PREG TEST UR: POSITIVE — AB

## 2018-08-07 MED ORDER — CEPHALEXIN 500 MG PO CAPS: 500.0000 mg | ORAL_CAPSULE | Freq: Four times a day (QID) | ORAL | 0 refills | Status: DC

## 2018-08-07 MED ORDER — ONDANSETRON HCL 4 MG/2ML IJ SOLN
4.0000 mg | Freq: Once | INTRAMUSCULAR | Status: AC
  Administered 2018-08-07: 4 mg via INTRAVENOUS
  Filled 2018-08-07: qty 2

## 2018-08-07 MED ORDER — KETOROLAC TROMETHAMINE 30 MG/ML IJ SOLN
30.0000 mg | Freq: Once | INTRAMUSCULAR | Status: AC
  Administered 2018-08-07: 30 mg via INTRAVENOUS
  Filled 2018-08-07: qty 1

## 2018-08-07 MED ORDER — ONDANSETRON 8 MG PO TBDP: ORAL_TABLET | ORAL | 0 refills | Status: AC

## 2018-08-07 MED ORDER — SODIUM CHLORIDE 0.9 % IV BOLUS
1000.0000 mL | Freq: Once | INTRAVENOUS | Status: AC
  Administered 2018-08-07: 1000 mL via INTRAVENOUS

## 2018-08-07 NOTE — ED Notes (Signed)
C/o n/v x 4 days and inability to hold down liquids x 4 days and "sleeping a lot". Also reports light sensitivity. No neuro deficits. VS WNL. No active vomiting.

## 2018-08-07 NOTE — Discharge Instructions (Addendum)
Keflex as prescribed.  Zofran as prescribed as needed for nausea.  There are liquid diet for the next 12 hours, then slowly advance to normal as tolerated.  Follow-up with your OB this week.  Call this morning to make this appointment.

## 2018-08-07 NOTE — ED Provider Notes (Signed)
[facility] EMERGENCY DEPARTMENT Provider Note   CSN: [id number] Arrival date & time: 08/06/18  2321     History   Chief Complaint Chief Complaint  Patient presents with  . Fatigue  . Emesis    HPI Candace Michael is a 28 y.o. female.  Patient is a 28 year old female with no significant past medical history presenting with a one-week history of weakness, nausea, fatigue, and has been vomiting for the past 2 days.  She states that she feels that something is off in her body.  She states that she has difficulty waking up and when she does, she just goes back to sleep.  She denies any fevers or chills.  She denies any abdominal pain.  She denies any ill contacts.  The history is provided by the patient.  Emesis  Severity:  Moderate Timing:  Constant Quality:  Stomach contents Progression:  Worsening Chronicity:  New Recent urination:  Normal Relieved by:  Nothing Worsened by:  Nothing Associated symptoms: no abdominal pain and no diarrhea     Past Medical History:  Diagnosis Date  . Seizures (HCC)     There are no active problems to display for this patient.   History reviewed. No pertinent surgical history.   OB History    Gravida  1   Para      Term      Preterm      AB      Living        SAB      TAB      Ectopic      Multiple      Live Births               Home Medications    Prior to Admission medications   Medication Sig Start Date End Date Taking? Authorizing Provider  levETIRAcetam (KEPPRA) 1000 MG tablet Take 1,000 mg by mouth 2 (two) times daily.    [provider]  naproxen (NAPROSYN) 500 MG tablet Take 1 tablet (500 mg total) by mouth 2 (two) times daily. 06/21/17   Horton, Mayer Masker, MD  ondansetron (ZOFRAN) 4 MG tablet Take 1 tablet (4 mg total) by mouth every 8 (eight) hours as needed for nausea or vomiting. 09/20/15   Nelva Nay, MD  phenazopyridine (PYRIDIUM) 200 MG tablet Take 1 tablet (200 mg  total) by mouth 3 (three) times daily. 07/03/14   Marisa Severin, MD  potassium chloride 20 MEQ TBCR Take 10 mEq by mouth daily. 09/20/15   Nelva Nay, MD  sulfamethoxazole-trimethoprim (SEPTRA DS) 800-160 MG per tablet Take 1 tablet by mouth every 12 (twelve) hours. 07/03/14   Marisa Severin, MD    Family History No family history on file.  Social History Social History   Tobacco Use  . Smoking status: Current Every Day Smoker    Types: Cigars  . Smokeless tobacco: Never Used  Substance Use Topics  . Alcohol use: Not Currently  . Drug use: Not Currently    Types: Marijuana     Allergies   Patient has no known allergies.   Review of Systems Review of Systems  Gastrointestinal: Positive for vomiting. Negative for abdominal pain and diarrhea.  All other systems reviewed and are negative.    Physical Exam Updated Vital Signs BP (!) 124/98 (BP Location: Left Arm)   Pulse (!) 110   Temp 98.7 F (37.1 C) (Oral)   Resp 20   Ht 5' (1.524 m)   Wt  56.2 kg   SpO2 100%   Breastfeeding No   BMI 24.22 kg/m   Physical Exam Vitals signs and nursing note reviewed.  Constitutional:      General: She is not in acute distress.    Appearance: She is well-developed. She is not diaphoretic.  HENT:     Head: Normocephalic and atraumatic.     Mouth/Throat:     Mouth: Mucous membranes are moist.     Pharynx: No oropharyngeal exudate or posterior oropharyngeal erythema.  Eyes:     Extraocular Movements: Extraocular movements intact.     Pupils: Pupils are equal, round, and reactive to light.  Neck:     Musculoskeletal: Normal range of motion and neck supple.  Cardiovascular:     Rate and Rhythm: Normal rate and regular rhythm.     Heart sounds: No murmur. No friction rub. No gallop.   Pulmonary:     Effort: Pulmonary effort is normal. No respiratory distress.     Breath sounds: Normal breath sounds. No wheezing.  Abdominal:     General: Bowel sounds are normal. There is no  distension.     Palpations: Abdomen is soft.     Tenderness: There is no abdominal tenderness.  Musculoskeletal: Normal range of motion.  Skin:    General: Skin is warm and dry.  Neurological:     General: No focal deficit present.     Mental Status: She is alert and oriented to person, place, and time.     Cranial Nerves: No cranial nerve deficit.     Sensory: No sensory deficit.     Motor: No weakness.     Coordination: Coordination normal.      ED Treatments / Results  Labs (all labs ordered are listed, but only abnormal results are displayed) Labs Reviewed  URINALYSIS, ROUTINE W REFLEX MICROSCOPIC  PREGNANCY, URINE  BASIC METABOLIC PANEL  CBC WITH DIFFERENTIAL/PLATELET  CBG MONITORING, ED  CBG MONITORING, ED    EKG EKG Interpretation  Date/Time:  Sunday August 06 2018 23:50:32 EST Ventricular Rate:  74 PR Interval:    QRS Duration: 91 QT Interval:  360 QTC Calculation: 400 R Axis:   62 Text Interpretation:  Sinus rhythm ST elev, probable normal early repol pattern Confirmed by Geoffery Lyons ([id number]) on 08/06/2018 11:53:29 PM   Radiology No results found.  Procedures Procedures (including critical care time)  Medications Ordered in ED Medications  sodium chloride 0.9 % bolus 1,000 mL (has no administration in time range)  ondansetron (ZOFRAN) injection 4 mg (has no administration in time range)  ketorolac (TORADOL) 30 MG/ML injection 30 mg (has no administration in time range)     Initial Impression / Assessment and Plan / ED Course  I have reviewed the triage vital signs and the nursing notes.  Pertinent labs & imaging results that were available during my care of the patient were reviewed by me and considered in my medical decision making (see chart for details).  Patient presenting here with weakness, nausea and vomiting, but no abdominal pain.  This appears to be a hyperemesis gravidarum situation.  The patient did not know that she was pregnant upon  presenting here.  I informed her of her positive pregnancy test and she is concerned that she does not do well with pregnancy.  She had similar experience with her first son.  Work-up is otherwise unremarkable.  She was hydrated and given medicine for nausea.  She is tolerating liquids and I believe appropriate for  discharge.  Final Clinical Impressions(s) / ED Diagnoses   Final diagnoses:  None    ED Discharge Orders    None       Geoffery Lyonselo, Ankit Degregorio, MD 08/07/18 [phone]

## 2018-08-07 NOTE — ED Notes (Signed)
Pt attempting urine sample 

## 2018-08-10 LAB — URINE CULTURE: Culture: >=100000 — AB

## 2018-08-11 ENCOUNTER — Telehealth: Payer: Self-pay | Admitting: *Deleted

## 2018-08-11 NOTE — Telephone Encounter (Signed)
Post ED Visit - Positive Culture Follow-up  Culture report reviewed by antimicrobial stewardship pharmacist:  []  Enzo Bi, Pharm.D. []  Celedonio Miyamoto, Pharm.D., BCPS AQ-ID []  Garvin Fila, Pharm.D., BCPS []  Georgina Pillion, Pharm.D., BCPS []  [place], [address].D., BCPS, AAHIVP []  Estella Husk, Pharm.D., BCPS, AAHIVP []  Lysle Pearl, PharmD, BCPS []  Phillips Climes, PharmD, BCPS []  Agapito Games, PharmD, BCPS []  Verlan Friends, PharmD Mervyn Gay, PharmD  Positive urine culture Treated with Cephalexin, organism sensitive to the same and no further patient follow-up is required at this time.  Virl Axe [facility] 08/11/2018, 12:23 PM

## 2019-04-08 ENCOUNTER — Emergency Department (HOSPITAL_BASED_OUTPATIENT_CLINIC_OR_DEPARTMENT_OTHER)
Admission: EM | Admit: 2019-04-08 | Discharge: 2019-04-08 | Disposition: A | Discharge status: Home / Self Care | Payer: Self-pay | Attending: Emergency Medicine | Admitting: Emergency Medicine

## 2019-04-08 ENCOUNTER — Other Ambulatory Visit: Payer: Self-pay

## 2019-04-08 ENCOUNTER — Encounter (HOSPITAL_BASED_OUTPATIENT_CLINIC_OR_DEPARTMENT_OTHER): Payer: Self-pay | Admitting: *Deleted

## 2019-04-08 ENCOUNTER — Emergency Department (HOSPITAL_BASED_OUTPATIENT_CLINIC_OR_DEPARTMENT_OTHER): Payer: Self-pay

## 2019-04-08 DIAGNOSIS — R519 Headache, unspecified: Secondary | ICD-10-CM | POA: Diagnosis not present

## 2019-04-08 DIAGNOSIS — Y999 Unspecified external cause status: Secondary | ICD-10-CM | POA: Insufficient documentation

## 2019-04-08 DIAGNOSIS — Y9389 Activity, other specified: Secondary | ICD-10-CM | POA: Insufficient documentation

## 2019-04-08 DIAGNOSIS — M542 Cervicalgia: Secondary | ICD-10-CM | POA: Insufficient documentation

## 2019-04-08 DIAGNOSIS — Y92410 Unspecified street and highway as the place of occurrence of the external cause: Secondary | ICD-10-CM | POA: Diagnosis not present

## 2019-04-08 DIAGNOSIS — N390 Urinary tract infection, site not specified: Secondary | ICD-10-CM | POA: Insufficient documentation

## 2019-04-08 DIAGNOSIS — F17290 Nicotine dependence, other tobacco product, uncomplicated: Secondary | ICD-10-CM | POA: Diagnosis not present

## 2019-04-08 DIAGNOSIS — S0990XA Unspecified injury of head, initial encounter: Secondary | ICD-10-CM | POA: Diagnosis present

## 2019-04-08 DIAGNOSIS — M545 Low back pain: Secondary | ICD-10-CM | POA: Diagnosis not present

## 2019-04-08 LAB — URINALYSIS, MICROSCOPIC (REFLEX)

## 2019-04-08 LAB — URINALYSIS, ROUTINE W REFLEX MICROSCOPIC
Bilirubin Urine: NEGATIVE
Glucose, UA: NEGATIVE mg/dL
Ketones, ur: NEGATIVE mg/dL
Nitrite: POSITIVE — AB
Protein, ur: NEGATIVE mg/dL
Specific Gravity, Urine: 1.02 (ref 1.005–1.030)
pH: 7.5 (ref 5.0–8.0)

## 2019-04-08 LAB — PREGNANCY, URINE: Preg Test, Ur: NEGATIVE

## 2019-04-08 LAB — WET PREP, GENITAL
Clue Cells Wet Prep HPF POC: NONE SEEN
Sperm: NONE SEEN
Trich, Wet Prep: NONE SEEN
Yeast Wet Prep HPF POC: NONE SEEN

## 2019-04-08 LAB — HIV ANTIBODY (ROUTINE TESTING W REFLEX): HIV Screen 4th Generation wRfx: NONREACTIVE

## 2019-04-08 MED ORDER — IBUPROFEN 600 MG PO TABS: 600.0000 mg | ORAL_TABLET | Freq: Four times a day (QID) | ORAL | 0 refills | Status: AC | PRN

## 2019-04-08 MED ORDER — CEPHALEXIN 500 MG PO CAPS: 500.0000 mg | ORAL_CAPSULE | Freq: Two times a day (BID) | ORAL | 0 refills | Status: AC

## 2019-04-08 MED ORDER — ACETAMINOPHEN 500 MG PO TABS: 500.0000 mg | ORAL_TABLET | Freq: Four times a day (QID) | ORAL | 0 refills | Status: AC | PRN

## 2019-04-08 MED ORDER — ACETAMINOPHEN 500 MG PO TABS
1000.0000 mg | ORAL_TABLET | Freq: Once | ORAL | Status: AC
  Administered 2019-04-08: 1000 mg via ORAL
  Filled 2019-04-08: qty 2

## 2019-04-08 MED ORDER — METHOCARBAMOL 500 MG PO TABS: 500.0000 mg | ORAL_TABLET | Freq: Two times a day (BID) | ORAL | 0 refills | Status: AC

## 2019-04-08 MED ORDER — ALBUTEROL SULFATE HFA 108 (90 BASE) MCG/ACT IN AERS
2.0000 | INHALATION_SPRAY | Freq: Once | RESPIRATORY_TRACT | Status: AC
  Administered 2019-04-08: 16:00:00 2 via RESPIRATORY_TRACT

## 2019-04-08 NOTE — ED Notes (Signed)
Spoke with Arbie Cookey in lab to add urine culture

## 2019-04-08 NOTE — ED Notes (Signed)
Pt reports pain 8/10 after waking patient up for hourly rounding
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
## 2019-04-08 NOTE — ED Notes (Signed)
ED Provider at bedside. 
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
## 2019-04-09 LAB — RPR: RPR Ser Ql: NONREACTIVE

## 2019-04-10 LAB — URINE CULTURE: Culture: >=100000 — AB

## 2019-04-10 LAB — GC/CHLAMYDIA PROBE AMP (~~LOC~~) NOT AT ARMC
Chlamydia: NEGATIVE
Neisseria Gonorrhea: NEGATIVE

## 2019-04-11 ENCOUNTER — Telehealth: Payer: Self-pay | Admitting: *Deleted

## 2019-04-11 NOTE — Telephone Encounter (Signed)
Post ED Visit - Positive Culture Follow-up  Culture report reviewed by antimicrobial stewardship pharmacist: [facility] Team []  Elenor Quinones, Pharm.D. []  Heide Guile, Pharm.D., BCPS AQ-ID []  Parks Neptune, Pharm.D., BCPS []  Alycia Rossetti, Pharm.D., BCPS []  [place], Pharm.D., BCPS, AAHIVP []  Legrand Como, Pharm.D., BCPS, AAHIVP [x]  Salome Arnt, PharmD, BCPS []  Johnnette Gourd, PharmD, BCPS []  Hughes Better, PharmD, BCPS []  Leeroy Cha, PharmD []  Laqueta Linden, PharmD, BCPS []  Albertina Parr, PharmD  [facility] Team []  Leodis Sias, PharmD []  Lindell Spar, PharmD []  Royetta Asal, PharmD []  Graylin Shiver, Rph []  Rema Fendt) Glennon Mac, PharmD []  Arlyn Dunning, PharmD []  Netta Cedars, PharmD []  Dia Sitter, PharmD []  Leone Haven, PharmD []  Gretta Arab, PharmD []  Theodis Shove, PharmD []  Peggyann Juba, PharmD []  Reuel Boom, PharmD   Positive urine culture Treated with Cephalexin, organism sensitive to the same and no further patient follow-up is required at this time.  Harlon Flor [facility] 04/11/2019, 10:06 AM

## 2020-04-14 ENCOUNTER — Emergency Department (HOSPITAL_BASED_OUTPATIENT_CLINIC_OR_DEPARTMENT_OTHER)
Admission: EM | Admit: 2020-04-14 | Discharge: 2020-04-14 | Disposition: A | Discharge status: Home / Self Care | Payer: Self-pay | Attending: Emergency Medicine | Admitting: Emergency Medicine

## 2020-04-14 ENCOUNTER — Encounter (HOSPITAL_BASED_OUTPATIENT_CLINIC_OR_DEPARTMENT_OTHER): Payer: Self-pay | Admitting: *Deleted

## 2020-04-14 ENCOUNTER — Other Ambulatory Visit: Payer: Self-pay

## 2020-04-14 DIAGNOSIS — Z79899 Other long term (current) drug therapy: Secondary | ICD-10-CM | POA: Insufficient documentation

## 2020-04-14 DIAGNOSIS — S01511A Laceration without foreign body of lip, initial encounter: Secondary | ICD-10-CM | POA: Insufficient documentation

## 2020-04-14 DIAGNOSIS — Z23 Encounter for immunization: Secondary | ICD-10-CM | POA: Insufficient documentation

## 2020-04-14 DIAGNOSIS — F17290 Nicotine dependence, other tobacco product, uncomplicated: Secondary | ICD-10-CM | POA: Insufficient documentation

## 2020-04-14 MED ORDER — TETANUS-DIPHTH-ACELL PERTUSSIS 5-2.5-18.5 LF-MCG/0.5 IM SUSP
0.5000 mL | Freq: Once | INTRAMUSCULAR | Status: AC
  Administered 2020-04-14: 0.5 mL via INTRAMUSCULAR
  Filled 2020-04-14: qty 0.5

## 2020-04-14 MED ORDER — AMOXICILLIN-POT CLAVULANATE 875-125 MG PO TABS: 1.0000 | ORAL_TABLET | Freq: Two times a day (BID) | ORAL | 0 refills | Status: AC
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
## 2020-04-14 NOTE — Discharge Instructions (Signed)
Take the antibiotics as prescribed to help prevent infection.  Recommend recheck with your primary doctor or in ER and 24 to 48 hours for wound check.  If you notice any significant worsening of the swelling, redness, drainage, return to ER for reassessment at that time.

## 2021-02-08 ENCOUNTER — Other Ambulatory Visit: Payer: Self-pay

## 2021-02-08 ENCOUNTER — Emergency Department (HOSPITAL_BASED_OUTPATIENT_CLINIC_OR_DEPARTMENT_OTHER)
Admission: EM | Admit: 2021-02-08 | Discharge: 2021-02-08 | Disposition: A | Discharge status: Home / Self Care | Payer: Self-pay | Attending: Emergency Medicine | Admitting: Emergency Medicine

## 2021-02-08 ENCOUNTER — Encounter (HOSPITAL_BASED_OUTPATIENT_CLINIC_OR_DEPARTMENT_OTHER): Payer: Self-pay

## 2021-02-08 DIAGNOSIS — F17290 Nicotine dependence, other tobacco product, uncomplicated: Secondary | ICD-10-CM | POA: Insufficient documentation

## 2021-02-08 DIAGNOSIS — M545 Low back pain, unspecified: Secondary | ICD-10-CM | POA: Insufficient documentation

## 2021-02-08 DIAGNOSIS — U071 COVID-19: Secondary | ICD-10-CM

## 2021-02-08 LAB — URINALYSIS, MICROSCOPIC (REFLEX)

## 2021-02-08 LAB — URINALYSIS, ROUTINE W REFLEX MICROSCOPIC
Glucose, UA: NEGATIVE mg/dL
Hgb urine dipstick: NEGATIVE
Ketones, ur: 15 mg/dL — AB
Nitrite: NEGATIVE
Protein, ur: NEGATIVE mg/dL
Specific Gravity, Urine: 1.025 (ref 1.005–1.030)
pH: 6.5 (ref 5.0–8.0)

## 2021-02-08 LAB — RESP PANEL BY RT-PCR (FLU A&B, COVID) ARPGX2
Influenza A by PCR: NEGATIVE
Influenza B by PCR: NEGATIVE
SARS Coronavirus 2 by RT PCR: POSITIVE — AB

## 2021-02-08 LAB — PREGNANCY, URINE: Preg Test, Ur: NEGATIVE

## 2021-02-08 MED ORDER — ACETAMINOPHEN 325 MG PO TABS
650.0000 mg | ORAL_TABLET | Freq: Once | ORAL | Status: AC | PRN
  Administered 2021-02-08: 650 mg via ORAL

## 2021-02-08 MED ORDER — ONDANSETRON 4 MG PO TBDP: 8.0000 mg | ORAL_TABLET | Freq: Once | ORAL | Status: DC

## 2021-02-08 MED ORDER — ACETAMINOPHEN 325 MG PO TABS
ORAL_TABLET | ORAL | Status: AC
  Filled 2021-02-08: qty 2

## 2021-02-08 NOTE — ED Notes (Signed)
Spoke with lab to add on urine culture 

## 2021-02-08 NOTE — ED Provider Notes (Signed)
[facility] EMERGENCY DEPARTMENT Provider Note   CSN: [id number] Arrival date & time: 02/08/21  7829     History Chief Complaint  Patient presents with   Fever    Headache, n/v   Nausea   Emesis    Candace Michael is a 30 y.o. female.  The history is provided by the patient.  Fever Max temp prior to arrival:  102 Temp source:  Oral Severity:  Moderate Onset quality:  Sudden Duration:  1 day Timing:  Constant Progression:  Unchanged Chronicity:  New Relieved by:  Nothing Worsened by:  Nothing Ineffective treatments:  Acetaminophen Associated symptoms: myalgias, nausea and vomiting   Associated symptoms: no chest pain, no chills, no cough, no dysuria, no ear pain, no rash, no rhinorrhea and no sore throat   Risk factors: no occupational exposure, no recent sickness and no sick contacts       Past Medical History:  Diagnosis Date   Seizures (HCC)    last seizure in 2019    There are no problems to display for this patient.   Past Surgical History:  Procedure Laterality Date   CESAREAN SECTION       OB History     Gravida  1   Para      Term      Preterm      AB      Living         SAB      IAB      Ectopic      Multiple      Live Births              No family history on file.  Social History   Tobacco Use   Smoking status: Every Day    Types: Cigars   Smokeless tobacco: Never  Vaping Use   Vaping Use: Never used  Substance Use Topics   Alcohol use: Not Currently   Drug use: Not Currently    Types: Marijuana    Home Medications Prior to Admission medications   Medication Sig Start Date End Date Taking? Authorizing Provider  acetaminophen (TYLENOL) 500 MG tablet Take 1 tablet (500 mg total) by mouth every 6 (six) hours as needed. 04/08/19   Law, Waylan Boga, PA-C  cephALEXin (KEFLEX) 500 MG capsule Take 1 capsule (500 mg total) by mouth 2 (two) times daily. 04/08/19   Law, Waylan Boga, PA-C  ibuprofen  (ADVIL) 600 MG tablet Take 1 tablet (600 mg total) by mouth every 6 (six) hours as needed. 04/08/19   Law, Waylan Boga, PA-C  levETIRAcetam (KEPPRA) 1000 MG tablet Take 1,000 mg by mouth 2 (two) times daily.    [provider]  methocarbamol (ROBAXIN) 500 MG tablet Take 1 tablet (500 mg total) by mouth 2 (two) times daily. 04/08/19   Law, Waylan Boga, PA-C  naproxen (NAPROSYN) 500 MG tablet Take 1 tablet (500 mg total) by mouth 2 (two) times daily. 06/21/17   Shon Baton, MD  ondansetron (ZOFRAN ODT) 8 MG disintegrating tablet 8mg  ODT q4 hours prn nausea 08/07/18   10/06/18, MD  ondansetron (ZOFRAN) 4 MG tablet Take 1 tablet (4 mg total) by mouth every 8 (eight) hours as needed for nausea or vomiting. 09/20/15   09/22/15, MD  phenazopyridine (PYRIDIUM) 200 MG tablet Take 1 tablet (200 mg total) by mouth 3 (three) times daily. 07/03/14   09/01/14, MD  potassium chloride 20 MEQ TBCR Take 10 mEq  by mouth daily. 09/20/15   Nelva Nay, MD  sulfamethoxazole-trimethoprim (SEPTRA DS) 800-160 MG per tablet Take 1 tablet by mouth every 12 (twelve) hours. 07/03/14   Marisa Severin, MD    Allergies    Patient has no known allergies.  Review of Systems   Review of Systems  Constitutional:  Positive for fever. Negative for chills.  HENT:  Negative for ear pain, rhinorrhea and sore throat.   Eyes:  Negative for pain and visual disturbance.  Respiratory:  Negative for cough and shortness of breath.   Cardiovascular:  Negative for chest pain and palpitations.  Gastrointestinal:  Positive for nausea and vomiting. Negative for abdominal pain.  Genitourinary:  Positive for frequency. Negative for dysuria and hematuria.  Musculoskeletal:  Positive for back pain and myalgias. Negative for arthralgias.  Skin:  Negative for color change and rash.  Neurological:  Negative for seizures and syncope.  All other systems reviewed and are negative.  Physical Exam Updated Vital Signs BP 116/75  (BP Location: Right Arm)   Pulse 96   Temp (!) 101.7 F (38.7 C) (Oral)   Resp 20   Ht 5' (1.524 m)   Wt 65.8 kg   SpO2 99%   BMI 28.32 kg/m   Physical Exam Vitals and nursing note reviewed.  HENT:     Head: Normocephalic and atraumatic.  Eyes:     General: No scleral icterus. Pulmonary:     Effort: Pulmonary effort is normal. No respiratory distress.  Abdominal:     General: There is no distension.     Palpations: Abdomen is soft.     Tenderness: There is no abdominal tenderness. There is no right CVA tenderness, left CVA tenderness or guarding.  Musculoskeletal:        General: No deformity or signs of injury.     Cervical back: Normal range of motion.  Skin:    General: Skin is warm and dry.  Neurological:     General: No focal deficit present.     Mental Status: She is alert and oriented to person, place, and time.  Psychiatric:        Mood and Affect: Mood normal.    ED Results / Procedures / Treatments   Labs (all labs ordered are listed, but only abnormal results are displayed) Labs Reviewed  URINALYSIS, ROUTINE W REFLEX MICROSCOPIC - Abnormal; Notable for the following components:      Result Value   APPearance HAZY (*)    Bilirubin Urine SMALL (*)    Ketones, ur 15 (*)    Leukocytes,Ua SMALL (*)    All other components within normal limits  URINALYSIS, MICROSCOPIC (REFLEX) - Abnormal; Notable for the following components:   Bacteria, UA RARE (*)    All other components within normal limits  RESP PANEL BY RT-PCR (FLU A&B, COVID) ARPGX2  URINE CULTURE  PREGNANCY, URINE    EKG None  Radiology No results found.  Procedures Procedures   Medications Ordered in ED Medications  ondansetron (ZOFRAN-ODT) disintegrating tablet 8 mg (8 mg Oral Not Given 02/08/21 0646)  acetaminophen (TYLENOL) tablet 650 mg (650 mg Oral Given 02/08/21 0640)    ED Course  I have reviewed the triage vital signs and the nursing notes.  Pertinent labs & imaging results  that were available during my care of the patient were reviewed by me and considered in my medical decision making (see chart for details).    MDM Rules/Calculators/A&P  [name] presents to the ED with fever, nausea, vomiting, headache, and lower back pain.  Urinalysis was slightly equivocal, and a culture will be sent.  However, she did test positive for COVID-19 which does explain her symptoms.  She is at low risk for complications from COVID-19 and does not meet criteria for treatment.  She was advised on isolation measures, symptomatic management, and return precautions. Final Clinical Impression(s) / ED Diagnoses Final diagnoses:  COVID-19    Rx / DC Orders ED Discharge Orders     None        Koleen Distance, MD 02/08/21 [id number]

## 2021-02-08 NOTE — ED Notes (Signed)
Pt discharged to home. Discharge instructions have been discussed with patient and/or family members. Pt verbally acknowledges understanding d/c instructions, and endorses comprehension to checkout at registration before leaving.  °

## 2021-02-08 NOTE — ED Triage Notes (Addendum)
Reports headache n/v that started last night.  Also reports lower back pain. Febrile in triage.

## 2021-02-09 LAB — URINE CULTURE

## 2021-02-16 ENCOUNTER — Encounter (HOSPITAL_BASED_OUTPATIENT_CLINIC_OR_DEPARTMENT_OTHER): Payer: Self-pay

## 2021-02-16 ENCOUNTER — Other Ambulatory Visit: Payer: Self-pay

## 2021-02-16 ENCOUNTER — Emergency Department (HOSPITAL_BASED_OUTPATIENT_CLINIC_OR_DEPARTMENT_OTHER)
Admission: EM | Admit: 2021-02-16 | Discharge: 2021-02-16 | Disposition: A | Discharge status: Home / Self Care | Payer: Self-pay | Attending: Emergency Medicine | Admitting: Emergency Medicine

## 2021-02-16 DIAGNOSIS — U071 COVID-19: Secondary | ICD-10-CM | POA: Insufficient documentation

## 2021-02-16 DIAGNOSIS — M545 Low back pain, unspecified: Secondary | ICD-10-CM | POA: Insufficient documentation

## 2021-02-16 DIAGNOSIS — F17290 Nicotine dependence, other tobacco product, uncomplicated: Secondary | ICD-10-CM | POA: Insufficient documentation

## 2021-02-16 MED ORDER — NAPROXEN 500 MG PO TABS: 500.0000 mg | ORAL_TABLET | Freq: Two times a day (BID) | ORAL | 0 refills | Status: AC

## 2021-02-16 NOTE — ED Provider Notes (Signed)
[facility] EMERGENCY DEPARTMENT Provider Note   CSN: [id number] Arrival date & time: 02/16/21  1940     History Chief Complaint  Patient presents with   Back Pain    Candace Michael is a 30 y.o. female.  30 y.o female with a PMH of Seizures presents to the ED with a chief complaint of back pain and covid test. She reports pain along the lumbar spine that has been ongoing,  The history is provided by the patient and medical records.  Back Pain Location:  Lumbar spine Quality:  Stiffness Stiffness is present:  All day Radiates to:  Does not radiate Pain severity:  Moderate Pain is:  Same all the time Onset quality:  Gradual Duration:  5 days Timing:  Constant Progression:  Unchanged Chronicity:  New Context: recent illness   Context: not emotional stress, not jumping from heights, not lifting heavy objects, not occupational injury and not pedestrian accident   Relieved by:  Nothing Ineffective treatments:  NSAIDs, OTC medications and ibuprofen Associated symptoms: no abdominal pain, no bladder incontinence, no bowel incontinence, no chest pain, no fever, no numbness, no paresthesias, no pelvic pain, no perianal numbness and no weakness   Risk factors: no hx of cancer, no lack of exercise, no menopause, not obese, not pregnant and no recent surgery       Past Medical History:  Diagnosis Date   Seizures (HCC)    last seizure in 2019    There are no problems to display for this patient.   Past Surgical History:  Procedure Laterality Date   CESAREAN SECTION       OB History     Gravida  1   Para      Term      Preterm      AB      Living         SAB      IAB      Ectopic      Multiple      Live Births              No family history on file.  Social History   Tobacco Use   Smoking status: Every Day    Types: Cigars   Smokeless tobacco: Never  Vaping Use   Vaping Use: Never used  Substance Use Topics   Alcohol use:  Not Currently   Drug use: Not Currently    Types: Marijuana    Home Medications Prior to Admission medications   Medication Sig Start Date End Date Taking? Authorizing Provider  naproxen (NAPROSYN) 500 MG tablet Take 1 tablet (500 mg total) by mouth 2 (two) times daily for 7 days. 02/16/21 02/23/21 Yes Karee Christopherson, Leonie Douglas, PA-C  acetaminophen (TYLENOL) 500 MG tablet Take 1 tablet (500 mg total) by mouth every 6 (six) hours as needed. 04/08/19   Law, Waylan Boga, PA-C  cephALEXin (KEFLEX) 500 MG capsule Take 1 capsule (500 mg total) by mouth 2 (two) times daily. 04/08/19   Law, Waylan Boga, PA-C  ibuprofen (ADVIL) 600 MG tablet Take 1 tablet (600 mg total) by mouth every 6 (six) hours as needed. 04/08/19   Law, Waylan Boga, PA-C  levETIRAcetam (KEPPRA) 1000 MG tablet Take 1,000 mg by mouth 2 (two) times daily.    [provider]  methocarbamol (ROBAXIN) 500 MG tablet Take 1 tablet (500 mg total) by mouth 2 (two) times daily. 04/08/19   Law, Waylan Boga, PA-C  ondansetron (ZOFRAN ODT)  8 MG disintegrating tablet 8mg  ODT q4 hours prn nausea 08/07/18   10/06/18, MD  ondansetron (ZOFRAN) 4 MG tablet Take 1 tablet (4 mg total) by mouth every 8 (eight) hours as needed for nausea or vomiting. 09/20/15   09/22/15, MD  phenazopyridine (PYRIDIUM) 200 MG tablet Take 1 tablet (200 mg total) by mouth 3 (three) times daily. 07/03/14   09/01/14, MD  potassium chloride 20 MEQ TBCR Take 10 mEq by mouth daily. 09/20/15   09/22/15, MD  sulfamethoxazole-trimethoprim (SEPTRA DS) 800-160 MG per tablet Take 1 tablet by mouth every 12 (twelve) hours. 07/03/14   09/01/14, MD    Allergies    Patient has no known allergies.  Review of Systems   Review of Systems  Constitutional:  Negative for fever.  Respiratory:  Positive for cough. Negative for shortness of breath.   Cardiovascular:  Negative for chest pain.  Gastrointestinal:  Negative for abdominal pain, bowel incontinence, nausea and vomiting.   Genitourinary:  Negative for bladder incontinence and pelvic pain.  Musculoskeletal:  Positive for back pain. Negative for gait problem.  Neurological:  Negative for weakness, numbness and paresthesias.   Physical Exam Updated Vital Signs BP (!) 118/95 (BP Location: Right Arm)   Pulse 81   Temp 98.3 F (36.8 C) (Oral)   Resp 16   Ht 5' (1.524 m)   Wt 65.8 kg   LMP 02/09/2021 (Approximate)   SpO2 99%   BMI 28.32 kg/m   Physical Exam Vitals and nursing note reviewed.  Constitutional:      General: She is not in acute distress.    Appearance: She is well-developed.  HENT:     Head: Normocephalic and atraumatic.  Eyes:     Conjunctiva/sclera: Conjunctivae normal.  Cardiovascular:     Rate and Rhythm: Normal rate and regular rhythm.     Heart sounds: No murmur heard. Pulmonary:     Effort: Pulmonary effort is normal. No respiratory distress.     Breath sounds: Normal breath sounds.  Abdominal:     Palpations: Abdomen is soft.     Tenderness: There is no abdominal tenderness.  Musculoskeletal:     Cervical back: Neck supple.     Comments: RLE- KF,KE 5/5 strength LLE- HF, HE 5/5 strength Normal gait. No pronator drift. No leg drop.  CN I, II and VIII not tested. CN II-XII grossly intact bilaterally.      Skin:    General: Skin is warm and dry.  Neurological:     Mental Status: She is alert.    ED Results / Procedures / Treatments   Labs (all labs ordered are listed, but only abnormal results are displayed) Labs Reviewed - No data to display  EKG None  Radiology No results found.  Procedures Procedures   Medications Ordered in ED Medications - No data to display  ED Course  I have reviewed the triage vital signs and the nursing notes.  Pertinent labs & imaging results that were available during my care of the patient were reviewed by me and considered in my medical decision making (see chart for details).    MDM Rules/Calculators/A&P    Patient  presents to the ED with a chief complaint of low back pain that has been ongoing for the past few days.  Diagnosed with COVID-19 on August 14, reports she has a residual cough, some lower back pain that she describes as stiffness.   During evaluation pain seems to be to  the whole lumbar spine area without any midline tenderness. No red flags such as fever, bowel or bladder incontinence. No trauma, no hx of IVDU. She is also requesting a Covid 19 test as she needs it in order to return back to work. I discussed she will likely test positive again on todays visit but I can provide her with a note for work. She is agreeable of this at this time.   Back pain without any red flags, we discussed symptomatic treatment along with return for worsening symptoms. Patient stable for disposition.    Portions of this note were generated with Scientist, clinical (histocompatibility and immunogenetics). Dictation errors may occur despite best attempts at proofreading.  Final Clinical Impression(s) / ED Diagnoses Final diagnoses:  Acute bilateral low back pain without sciatica    Rx / DC Orders ED Discharge Orders          Ordered    naproxen (NAPROSYN) 500 MG tablet  2 times daily        02/16/21 2109             Claude Manges, Cordelia Poche 02/16/21 2119    Vanetta Mulders, MD 02/18/21 2011

## 2021-02-16 NOTE — ED Triage Notes (Signed)
Pt reports mid lower back pain, onset 8/14. Denies urinary symptoms or recent injury. She is also requesting a covid test. She says she tested positive here on 8/14 and needs a negative test to return to work.

## 2021-02-16 NOTE — Discharge Instructions (Addendum)
I have provided a short course of anti-inflammatories to help with your back pain.  Please take 1 tablet twice a day for the next 7 days.  In addition you may apply ice or heat to the area to help with pain.
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
